# Patient Record
Sex: Female | Born: 2000 | Race: Black or African American | Hispanic: No | Marital: Single
Health system: Southern US, Community
[De-identification: ages and names within clinical notes are randomized; demographics above are authoritative.]

## PROBLEM LIST (undated history)

## (undated) DIAGNOSIS — D649 Anemia, unspecified: Secondary | ICD-10-CM

---

## 2021-07-12 ENCOUNTER — Other Ambulatory Visit: Payer: Self-pay

## 2021-07-12 ENCOUNTER — Emergency Department (HOSPITAL_COMMUNITY): Payer: BC Managed Care – PPO

## 2021-07-12 ENCOUNTER — Encounter (HOSPITAL_COMMUNITY): Payer: Self-pay | Admitting: Emergency Medicine

## 2021-07-12 ENCOUNTER — Emergency Department (HOSPITAL_COMMUNITY)
Admission: EM | Admit: 2021-07-12 | Discharge: 2021-07-13 | Disposition: A | Discharge status: Home / Self Care | Payer: BC Managed Care – PPO | Attending: Emergency Medicine | Admitting: Emergency Medicine

## 2021-07-12 DIAGNOSIS — R110 Nausea: Secondary | ICD-10-CM

## 2021-07-12 DIAGNOSIS — R1032 Left lower quadrant pain: Secondary | ICD-10-CM | POA: Insufficient documentation

## 2021-07-12 DIAGNOSIS — R112 Nausea with vomiting, unspecified: Secondary | ICD-10-CM | POA: Insufficient documentation

## 2021-07-12 HISTORY — DX: Anemia, unspecified: D64.9

## 2021-07-12 LAB — URINALYSIS, ROUTINE W REFLEX MICROSCOPIC
Bilirubin Urine: NEGATIVE
Glucose, UA: NEGATIVE mg/dL
Hgb urine dipstick: NEGATIVE
Ketones, ur: NEGATIVE mg/dL
Leukocytes,Ua: NEGATIVE
Nitrite: NEGATIVE
Protein, ur: NEGATIVE mg/dL
Specific Gravity, Urine: 1.02 (ref 1.005–1.030)
pH: 7 (ref 5.0–8.0)

## 2021-07-12 LAB — I-STAT BETA HCG BLOOD, ED (MC, WL, AP ONLY): I-stat hCG, quantitative: <5 m[IU]/mL (ref <5)

## 2021-07-12 LAB — COMPREHENSIVE METABOLIC PANEL
ALT: 13 U/L (ref 0–44)
AST: 16 U/L (ref 15–41)
Albumin: 3.2 g/dL — ABNORMAL LOW (ref 3.5–5.0)
Alkaline Phosphatase: 57 U/L (ref 38–126)
Anion gap: 9 (ref 5–15)
BUN: 12 mg/dL (ref 6–20)
CO2: 22 mmol/L (ref 22–32)
Calcium: 8.7 mg/dL — ABNORMAL LOW (ref 8.9–10.3)
Chloride: 107 mmol/L (ref 98–111)
Creatinine, Ser: 0.7 mg/dL (ref 0.44–1.00)
GFR, Estimated: >60 mL/min (ref >60)
Glucose, Bld: 104 mg/dL — ABNORMAL HIGH (ref 70–99)
Potassium: 3.9 mmol/L (ref 3.5–5.1)
Sodium: 138 mmol/L (ref 135–145)
Total Bilirubin: 0.2 mg/dL — ABNORMAL LOW (ref 0.3–1.2)
Total Protein: 6.6 g/dL (ref 6.5–8.1)

## 2021-07-12 LAB — CBC
HCT: 34.6 % — ABNORMAL LOW (ref 36.0–46.0)
Hemoglobin: 10.2 g/dL — ABNORMAL LOW (ref 12.0–15.0)
MCH: 20.6 pg — ABNORMAL LOW (ref 26.0–34.0)
MCHC: 29.5 g/dL — ABNORMAL LOW (ref 30.0–36.0)
MCV: 70 fL — ABNORMAL LOW (ref 80.0–100.0)
Platelets: 441 10*3/uL — ABNORMAL HIGH (ref 150–400)
RBC: 4.94 MIL/uL (ref 3.87–5.11)
RDW: 17.2 % — ABNORMAL HIGH (ref 11.5–15.5)
WBC: 10.3 10*3/uL (ref 4.0–10.5)
nRBC: 0 % (ref 0.0–0.2)

## 2021-07-12 LAB — LIPASE, BLOOD: Lipase: 31 U/L (ref 11–51)

## 2021-07-12 NOTE — ED Provider Notes (Signed)
Emergency Medicine Provider Triage Evaluation Note  Alyssa Ruiz , a 20 y.o. female  was evaluated in triage.  Pt complains of gradual onset, constant, sharp, left lower quadrant abdominal pain that began earlier this morning with associated nausea and nonbloody nonbilious emesis.  She last had a normal bowel movement this morning.  Denies any vaginal discharge.  Last normal menstrual cycle was 11/19.  She reports history of ovarian cyst rupture in April of last year and states this feels very similar.  She states she did have vomiting with same.  Believes the cyst was on her left side as well.  Review of Systems  Positive: + LLQ pain, nausea, vomiting Negative: - diarrhea, fevers, chills  Physical Exam  BP 126/79 (BP Location: Left Arm)   Pulse (!) 114   Temp 99.1 F (37.3 C) (Oral)   Resp 15   LMP 06/24/2021 (Exact Date)   SpO2 100%  Gen:   Awake, no distress   Resp:  Normal effort  MSK:   Moves extremities without difficulty  Other:  Tachycardic. + LLQ abd pain.   Medical Decision Making  Medically screening exam initiated at 4:57 PM.  Appropriate orders placed.  Alyssa Ruiz was informed that the remainder of the evaluation will be completed by another provider, this initial triage assessment does not replace that evaluation, and the importance of remaining in the ED until their evaluation is complete.     Tanda Rockers, PA-C 07/12/21 1658    Gerhard Munch, MD 07/12/21 1800

## 2021-07-12 NOTE — ED Triage Notes (Signed)
Patient here for evaluation of emesis and lower left abdominal pain that started this morning, patient states she felt similar symptoms last year when she had an ovarian cyst rupture. Patient alert, oriented, and in no apparent distress at this time.

## 2021-07-13 ENCOUNTER — Encounter (HOSPITAL_COMMUNITY): Payer: Self-pay | Admitting: Emergency Medicine

## 2021-07-13 MED ORDER — SODIUM CHLORIDE 0.9 % IV BOLUS
1000.0000 mL | Freq: Once | INTRAVENOUS | Status: AC
  Administered 2021-07-13: 1000 mL via INTRAVENOUS

## 2021-07-13 MED ORDER — MECLIZINE HCL 25 MG PO TABS: 12.5000 mg | ORAL_TABLET | Freq: Once | ORAL | Status: DC

## 2021-07-13 MED ORDER — ONDANSETRON HCL 4 MG PO TABS
8.0000 mg | ORAL_TABLET | Freq: Once | ORAL | Status: AC
  Administered 2021-07-13: 8 mg via ORAL
  Filled 2021-07-13: qty 2

## 2021-07-13 MED ORDER — KETOROLAC TROMETHAMINE 30 MG/ML IJ SOLN
30.0000 mg | Freq: Once | INTRAMUSCULAR | Status: AC
  Administered 2021-07-13: 30 mg via INTRAMUSCULAR
  Filled 2021-07-13: qty 1

## 2021-07-13 MED ORDER — ALUM & MAG HYDROXIDE-SIMETH 200-200-20 MG/5ML PO SUSP
30.0000 mL | Freq: Once | ORAL | Status: AC
  Administered 2021-07-13: 30 mL via ORAL
  Filled 2021-07-13: qty 30

## 2021-07-13 MED ORDER — DICYCLOMINE HCL 10 MG/5ML PO SOLN
10.0000 mg | Freq: Once | ORAL | Status: AC
  Administered 2021-07-13: 10 mg via ORAL
  Filled 2021-07-13: qty 5

## 2021-07-13 MED ORDER — PROCHLORPERAZINE EDISYLATE 10 MG/2ML IJ SOLN
10.0000 mg | Freq: Once | INTRAMUSCULAR | Status: AC
  Administered 2021-07-13: 10 mg via INTRAVENOUS
  Filled 2021-07-13: qty 2

## 2021-07-13 MED ORDER — LIDOCAINE VISCOUS HCL 2 % MT SOLN
15.0000 mL | Freq: Once | OROMUCOSAL | Status: AC
  Administered 2021-07-13: 15 mL via ORAL
  Filled 2021-07-13: qty 15

## 2021-07-13 MED ORDER — ONDANSETRON HCL 4 MG PO TABS: 4.0000 mg | ORAL_TABLET | Freq: Four times a day (QID) | ORAL | 0 refills | Status: AC

## 2021-07-13 NOTE — ED Notes (Signed)
Pt verbalized understanding of d/c instructions, meds and followup care. Denies questions. VSS, no distress noted. Steady gait to exit with all belongings.  ?

## 2021-07-13 NOTE — ED Provider Notes (Signed)
[facility] EMERGENCY DEPARTMENT Provider Note   CSN: [id number] Arrival date & time: 07/12/21  [id number]     History Chief Complaint  Patient presents with   Emesis    Alyssa Ruiz is a 20 y.o. female. Patient presents to the emergency department with left lower quadrant abdominal pain that started a morning.  She says that his been gradually worsening.  She describes it as being constant and sharp.  She has had associated nausea with some vomiting that has been nonbloody and nonbilious.  She has not vomited since yesterday.  She has had similar symptoms before one other time when she had a ruptured ovarian cyst.  She says that she feels like she is going to have diarrhea, however has had not had any of this and also denies constipation.  Denies melena.  She denies chest pain, shortness of breath, fevers, chills, vaginal discharge, dysuria, or hematuria.    Emesis Associated symptoms: abdominal pain   Associated symptoms: no arthralgias, no chills, no cough, no diarrhea, no fever and no sore throat       Past Medical History:  Diagnosis Date   Anemia     There are no problems to display for this patient.   History reviewed. No pertinent surgical history.   OB History   No obstetric history on file.     History reviewed. No pertinent family history.     Home Medications Prior to Admission medications   Medication Sig Start Date End Date Taking? Authorizing Provider  ondansetron (ZOFRAN) 4 MG tablet Take 1 tablet (4 mg total) by mouth every 6 (six) hours for 5 days. 07/13/21 07/18/21 Yes Shyam Dawson, Adora Fridge, PA-C    Allergies    Patient has no known allergies.  Review of Systems   Review of Systems  Constitutional:  Negative for chills and fever.  HENT:  Negative for ear pain and sore throat.   Eyes:  Negative for pain and visual disturbance.  Respiratory:  Negative for cough and shortness of breath.   Cardiovascular:  Negative for chest pain and  palpitations.  Gastrointestinal:  Positive for abdominal pain, nausea and vomiting. Negative for abdominal distention, blood in stool, constipation and diarrhea.  Genitourinary:  Negative for dysuria, hematuria, vaginal bleeding and vaginal discharge.  Musculoskeletal:  Negative for arthralgias and back pain.  Skin:  Negative for color change and rash.  Neurological:  Negative for seizures and syncope.  Psychiatric/Behavioral:  Negative for confusion.   All other systems reviewed and are negative.  Physical Exam Updated Vital Signs BP (!) 93/57   Pulse 86   Temp 98 F (36.7 C) (Oral)   Resp 17   LMP 06/24/2021 (Exact Date)   SpO2 100%   Physical Exam Vitals and nursing note reviewed.  Constitutional:      General: She is not in acute distress.    Appearance: Normal appearance. She is well-developed. She is not ill-appearing, toxic-appearing or diaphoretic.  HENT:     Head: Normocephalic and atraumatic.     Nose: No nasal deformity.     Mouth/Throat:     Lips: Pink. No lesions.  Eyes:     General: Gaze aligned appropriately. No scleral icterus.       Right eye: No discharge.        Left eye: No discharge.     Conjunctiva/sclera: Conjunctivae normal.     Right eye: Right conjunctiva is not injected. No exudate or hemorrhage.    Left eye: Left  conjunctiva is not injected. No exudate or hemorrhage. Cardiovascular:     Rate and Rhythm: Normal rate and regular rhythm.     Pulses: Normal pulses.          Radial pulses are 2+ on the right side and 2+ on the left side.       Dorsalis pedis pulses are 2+ on the right side and 2+ on the left side.     Heart sounds: Normal heart sounds. No murmur heard.   No friction rub. No gallop. No S3 or S4 sounds.  Pulmonary:     Effort: Pulmonary effort is normal. No respiratory distress.     Breath sounds: Normal breath sounds. No stridor. No wheezing, rhonchi or rales.  Abdominal:     General: Abdomen is flat. There is no distension.      Palpations: Abdomen is soft.     Tenderness: There is abdominal tenderness. There is no right CVA tenderness, left CVA tenderness, guarding or rebound.     Comments: Mild tenderness to left lower quadrant of abdomen.  No peritoneal signs.  Musculoskeletal:     Right lower leg: No edema.     Left lower leg: No edema.  Skin:    General: Skin is warm and dry.     Coloration: Skin is not jaundiced or pale.     Findings: No bruising, erythema, lesion or rash.  Neurological:     Mental Status: She is alert and oriented to person, place, and time.  Psychiatric:        Mood and Affect: Mood normal.        Speech: Speech normal.        Behavior: Behavior normal. Behavior is cooperative.    ED Results / Procedures / Treatments   Labs (all labs ordered are listed, but only abnormal results are displayed) Labs Reviewed  COMPREHENSIVE METABOLIC PANEL - Abnormal; Notable for the following components:      Result Value   Glucose, Bld 104 (*)    Calcium 8.7 (*)    Albumin 3.2 (*)    Total Bilirubin 0.2 (*)    All other components within normal limits  CBC - Abnormal; Notable for the following components:   Hemoglobin 10.2 (*)    HCT 34.6 (*)    MCV 70.0 (*)    MCH 20.6 (*)    MCHC 29.5 (*)    RDW 17.2 (*)    Platelets 441 (*)    All other components within normal limits  LIPASE, BLOOD  URINALYSIS, ROUTINE W REFLEX MICROSCOPIC  I-STAT BETA HCG BLOOD, ED ([facility], [facility], [facility] ONLY)    EKG None  Radiology US Transvaginal Non-OB  Result Date: 07/12/2021 CLINICAL DATA:  Left lower quadrant pain. EXAM: TRANSABDOMINAL AND TRANSVAGINAL ULTRASOUND OF PELVIS DOPPLER ULTRASOUND OF OVARIES TECHNIQUE: Both transabdominal and transvaginal ultrasound examinations of the pelvis were performed. Transabdominal technique was performed for global imaging of the pelvis including uterus, ovaries, adnexal regions, and pelvic cul-de-sac. It was necessary to proceed with endovaginal exam following the transabdominal  exam to visualize the left ovary. Color and duplex Doppler ultrasound was utilized to evaluate blood flow to the ovaries. COMPARISON:  None. FINDINGS: Uterus Measurements: 6.8 x 4.4 x 3.8 cm = volume: 60 mL. No fibroids or other mass visualized. Endometrium Thickness: 10.3.  No focal abnormality visualized. Right ovary Measurements: 3.3 x 3.4 x 2.6 cm = volume: 15 mL. 2.0 x 1.6 x 1.5 cm hemorrhagic/corpus luteal cyst in the right ovary,  which is physiologic and requires no follow-up. Left ovary Measurements: 3.5 x 3.4 x 1.7 cm = volume: 11 mL. Normal appearance/no adnexal mass. Pulsed Doppler evaluation of both ovaries demonstrates normal low-resistance arterial and venous waveforms. Other findings Trace pelvic free fluid. IMPRESSION: 2 cm hemorrhagic/corpus luteal cyst in the right ovary which is physiologic and requires no further imaging follow-up. Otherwise unremarkable pelvic ultrasound. Specifically no evidence of ovarian torsion. Electronically Signed   By: Maudry Mayhew M.D.   On: 07/12/2021 17:59   US Pelvis Complete  Result Date: 07/12/2021 CLINICAL DATA:  Left lower quadrant pain. EXAM: TRANSABDOMINAL AND TRANSVAGINAL ULTRASOUND OF PELVIS DOPPLER ULTRASOUND OF OVARIES TECHNIQUE: Both transabdominal and transvaginal ultrasound examinations of the pelvis were performed. Transabdominal technique was performed for global imaging of the pelvis including uterus, ovaries, adnexal regions, and pelvic cul-de-sac. It was necessary to proceed with endovaginal exam following the transabdominal exam to visualize the left ovary. Color and duplex Doppler ultrasound was utilized to evaluate blood flow to the ovaries. COMPARISON:  None. FINDINGS: Uterus Measurements: 6.8 x 4.4 x 3.8 cm = volume: 60 mL. No fibroids or other mass visualized. Endometrium Thickness: 10.3.  No focal abnormality visualized. Right ovary Measurements: 3.3 x 3.4 x 2.6 cm = volume: 15 mL. 2.0 x 1.6 x 1.5 cm hemorrhagic/corpus luteal cyst in  the right ovary, which is physiologic and requires no follow-up. Left ovary Measurements: 3.5 x 3.4 x 1.7 cm = volume: 11 mL. Normal appearance/no adnexal mass. Pulsed Doppler evaluation of both ovaries demonstrates normal low-resistance arterial and venous waveforms. Other findings Trace pelvic free fluid. IMPRESSION: 2 cm hemorrhagic/corpus luteal cyst in the right ovary which is physiologic and requires no further imaging follow-up. Otherwise unremarkable pelvic ultrasound. Specifically no evidence of ovarian torsion. Electronically Signed   By: Maudry Mayhew M.D.   On: 07/12/2021 17:59   [place] Art/Ven Flow Abd Pelv Doppler  Result Date: 07/12/2021 CLINICAL DATA:  Left lower quadrant pain. EXAM: TRANSABDOMINAL AND TRANSVAGINAL ULTRASOUND OF PELVIS DOPPLER ULTRASOUND OF OVARIES TECHNIQUE: Both transabdominal and transvaginal ultrasound examinations of the pelvis were performed. Transabdominal technique was performed for global imaging of the pelvis including uterus, ovaries, adnexal regions, and pelvic cul-de-sac. It was necessary to proceed with endovaginal exam following the transabdominal exam to visualize the left ovary. Color and duplex Doppler ultrasound was utilized to evaluate blood flow to the ovaries. COMPARISON:  None. FINDINGS: Uterus Measurements: 6.8 x 4.4 x 3.8 cm = volume: 60 mL. No fibroids or other mass visualized. Endometrium Thickness: 10.3.  No focal abnormality visualized. Right ovary Measurements: 3.3 x 3.4 x 2.6 cm = volume: 15 mL. 2.0 x 1.6 x 1.5 cm hemorrhagic/corpus luteal cyst in the right ovary, which is physiologic and requires no follow-up. Left ovary Measurements: 3.5 x 3.4 x 1.7 cm = volume: 11 mL. Normal appearance/no adnexal mass. Pulsed Doppler evaluation of both ovaries demonstrates normal low-resistance arterial and venous waveforms. Other findings Trace pelvic free fluid. IMPRESSION: 2 cm hemorrhagic/corpus luteal cyst in the right ovary which is physiologic and requires no  further imaging follow-up. Otherwise unremarkable pelvic ultrasound. Specifically no evidence of ovarian torsion. Electronically Signed   By: Maudry Mayhew M.D.   On: 07/12/2021 17:59    Procedures Procedures   Medications Ordered in ED Medications  ketorolac (TORADOL) 30 MG/ML injection 30 mg (30 mg Intramuscular Given 07/13/21 0922)  ondansetron (ZOFRAN) tablet 8 mg (8 mg Oral Given 07/13/21 0918)  alum & mag hydroxide-simeth (MAALOX/MYLANTA) 200-200-20 MG/5ML suspension 30 mL (  30 mLs Oral Given 07/13/21 0922)    And  lidocaine (XYLOCAINE) 2 % viscous mouth solution 15 mL (15 mLs Oral Given 07/13/21 0922)  dicyclomine (BENTYL) 10 MG/5ML solution 10 mg (10 mg Oral Given 07/13/21 0922)  sodium chloride 0.9 % bolus 1,000 mL (1,000 mLs Intravenous New Bag/Given 07/13/21 1024)  prochlorperazine (COMPAZINE) injection 10 mg (10 mg Intravenous Given 07/13/21 1024)    ED Course  I have reviewed the triage vital signs and the nursing notes.  Pertinent labs & imaging results that were available during my care of the patient were reviewed by me and considered in my medical decision making (see chart for details).    MDM Rules/Calculators/A&P                         This is a 20 y.o. female with a PMH of anemia and ovarian cysts who presents to the ED with complaints of constant LLQ pain that started two days ago and has gradually been worsening. She has had associated nausea and vomiting with no diarrhea, but does have diarrhea sensations in her stomach.  No other systemic symptoms present   Vitals: afebrile. She was initially tachycardic on arrival, however by the time I examined patient, her vitals were all stable.  Patient is well appearing and in no acute distress. Abdominal exam with very minimal tenderness to the LLQ. Patient has no peritoneal signs or outwards signs of having pain when I press on this area. She does not look overtly dry. Apparently she is tolerating PO intake very well.   I  personally reviewed all laboratory work and imaging.  CBC is significant for microcytic anemia which appears to be baseline.  Likely due to iron deficiency.  CMP with no concerning labs.  UA negative for infection or other process.  Lipase is negative.  Pregnancy negative.  Ultrasound shows a 2 cm hemorrhagic or corpus luteal cyst in the right ovary which is physiologic and requires no further imaging follow-up.  It does not show any findings in the left ovary.  Labs and imaging are overall unrevealing.  Patient could have had a left ovarian cyst that is since ruptured and not shown on the ultrasound.  Also could be Mittelschmerz as she is mid cycle in her menstrual periods. She also could be having gastroenteritis is leading to her having the nausea and vomiting and diarrhea sensations.  Doubt appendicitis, diverticulitis, or PID given that history, exam, and labs are not consistent.  Overall, I am not super impressed with her exam.  We will trial her on a GI cocktail with antiemetics and Toradol for pain.    [id number]: Reassessed patient. She states that her pain has improved, however she is still nauseous. She has not been drinking much water since she arrived in the ED about 12 hours ago, so will give a liter of fluids and try Compazine.   1045: Reassessed patient. She is now feeling better and is ready to go home.  I discussed with patient the need to follow up with her PCP and gave return precautions for symptom recurrence or worsening. Patient will finish fluids and be discharged home.    Dispo Plan: Likely discharge home with antiemetics.    Portions of this note were generated with [employer]. Dictation errors may occur despite best attempts at proofreading.  Final Clinical Impression(s) / ED Diagnoses Final diagnoses:  Left lower quadrant abdominal pain  Nausea  Rx / DC Orders ED Discharge Orders          Ordered    ondansetron (ZOFRAN) 4 MG tablet  Every 6 hours         07/13/21 1014             Mallie Giambra, Adora Fridge, PA-C 07/13/21 1101    Sherwood Gambler, MD 07/13/21 1305

## 2021-07-13 NOTE — ED Notes (Signed)
Pt called staff to say she had wet herself, had accident. Pt unable to answer why she did not call out or try to get up to use the bathroom, but urinated throughout the stretcher and onto the floor. Pt states symptoms are improved. Pt given blue paper scrubs and shown bathroom to change. EDP updated on pt condition and improvement of GI symptoms, so pt will be d/cd.

## 2021-07-13 NOTE — Discharge Instructions (Signed)

## 2022-05-15 ENCOUNTER — Emergency Department (HOSPITAL_COMMUNITY)
Admission: EM | Admit: 2022-05-15 | Discharge: 2022-05-15 | Disposition: A | Discharge status: Home / Self Care | Payer: BC Managed Care – PPO | Attending: Emergency Medicine | Admitting: Emergency Medicine

## 2022-05-15 ENCOUNTER — Encounter (HOSPITAL_COMMUNITY): Payer: Self-pay

## 2022-05-15 ENCOUNTER — Other Ambulatory Visit: Payer: Self-pay

## 2022-05-15 DIAGNOSIS — R002 Palpitations: Secondary | ICD-10-CM

## 2022-05-15 NOTE — ED Triage Notes (Signed)
Pt said she took a extra 1/2 tab of her ritalin which was 5 mg more than normal and started having a sensation that her heart was racing.

## 2022-05-15 NOTE — ED Provider Notes (Signed)
[facility]-EMERGENCY DEPT Provider Note  CSN: [id number] Arrival date & time: 05/15/22 0117  Chief Complaint(s) Anxiety  HPI Alyssa Ruiz is a 21 y.o. female    The history is provided by the patient.  Palpitations Palpitations quality:  Fast Onset quality:  Gradual Duration:  1 hour Timing:  Constant Progression:  Resolved Chronicity:  New Context comment:  Took extra dose of Ritalin this evening Relieved by: self resolved. Worsened by:  Nothing Associated symptoms: no chest pain, no cough, no diaphoresis, no leg pain, no lower extremity edema, no malaise/fatigue, no nausea, no numbness, no shortness of breath and no vomiting     Past Medical History Past Medical History:  Diagnosis Date   Anemia    There are no problems to display for this patient.  Home Medication(s) Prior to Admission medications   Not on File                                                                                                                                    Allergies Patient has no known allergies.  Review of Systems Review of Systems  Constitutional:  Negative for diaphoresis and malaise/fatigue.  Respiratory:  Negative for cough and shortness of breath.   Cardiovascular:  Positive for palpitations. Negative for chest pain.  Gastrointestinal:  Negative for nausea and vomiting.  Neurological:  Negative for numbness.   As noted in HPI  Physical Exam Vital Signs  I have reviewed the triage vital signs BP 119/86   Pulse 65   Temp 98.6 F (37 C)   Resp 18   Ht 5\' 5"  (1.651 m)   Wt 99.8 kg   LMP 05/07/2022   SpO2 100%   BMI 36.61 kg/m    Physical Exam Vitals reviewed.  Constitutional:      General: She is not in acute distress.    Appearance: She is well-developed. She is not diaphoretic.  HENT:     Head: Normocephalic and atraumatic.     Nose: Nose normal.  Eyes:     General: No scleral icterus.       Right eye: No discharge.         Left eye: No discharge.     Conjunctiva/sclera: Conjunctivae normal.     Pupils: Pupils are equal, round, and reactive to light.  Cardiovascular:     Rate and Rhythm: Normal rate and regular rhythm.     Heart sounds: No murmur heard.    No friction rub. No gallop.  Pulmonary:     Effort: Pulmonary effort is normal. No respiratory distress.     Breath sounds: Normal breath sounds. No stridor. No rales.  Abdominal:     General: There is no distension.     Palpations: Abdomen is soft.     Tenderness: There is no abdominal tenderness.  Musculoskeletal:        General: No tenderness.  Cervical back: Normal range of motion and neck supple.  Skin:    General: Skin is warm and dry.     Findings: No erythema or rash.  Neurological:     Mental Status: She is alert and oriented to person, place, and time.     ED Results and Treatments Labs (all labs ordered are listed, but only abnormal results are displayed) Labs Reviewed - No data to display                                                                                                                       EKG  EKG Interpretation  Date/Time:  Tuesday May 15 2022 03:33:53 EDT Ventricular Rate:  59 PR Interval:  132 QRS Duration: 89 QT Interval:  412 QTC Calculation: 409 R Axis:   55 Text Interpretation: Sinus rhythm Confirmed by Addison Lank [phone] on 05/15/2022 3:37:45 AM       Radiology No results found.  Medications Ordered in ED Medications - No data to display                                                                                                                                   Procedures Procedures  (including critical care time)  Medical Decision Making / ED Course   Medical Decision Making Amount and/or Complexity of Data Reviewed ECG/medicine tests: ordered and independent interpretation performed. Decision-making details documented in ED Course.    Side effect from extra dose of  Ritalin. Palpitations resolved. Less concerning for other likely etiologies including electrolyte derangements, severe anemia, PE. EKG without acute dysrhythmias or interval changes. Patient monitored for several hours without recurrence.     Final Clinical Impression(s) / ED Diagnoses Final diagnoses:  Palpitations   The patient appears reasonably screened and/or stabilized for discharge and I doubt any other medical condition or other [facility] requiring further screening, evaluation, or treatment in the ED at this time. I have discussed the findings, Dx and Tx plan with the patient/family who expressed understanding and agree(s) with the plan. Discharge instructions discussed at length. The patient/family was given strict return precautions who verbalized understanding of the instructions. No further questions at time of discharge.  Disposition: Discharge  Condition: Good  ED Discharge Orders     None        Follow Up: Primary care provider  Call  as needed  This chart was dictated using voice recognition software.  Despite best efforts to proofread,  errors can occur which can change the documentation meaning.    Nira Conn, MD 05/15/22 [phone]

## 2022-07-22 ENCOUNTER — Ambulatory Visit (HOSPITAL_COMMUNITY)
Admission: EM | Admit: 2022-07-22 | Discharge: 2022-07-22 | Disposition: A | Discharge status: Home / Self Care | Payer: No Payment, Other | Attending: Urology | Admitting: Urology

## 2022-07-22 DIAGNOSIS — F319 Bipolar disorder, unspecified: Secondary | ICD-10-CM | POA: Insufficient documentation
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
## 2022-07-22 NOTE — Discharge Instructions (Signed)
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
## 2022-07-23 NOTE — ED Provider Notes (Incomplete)
Behavioral Health Urgent Care Medical Screening Exam  Patient Name: Alyssa Ruiz MRN: [id number] Date of Evaluation: 07/23/22 Chief Complaint:   Diagnosis:  Final diagnoses:  Bipolar I disorder (HCC)    History of Present illness: Alyssa Ruiz is a 21 y.o. female with a self-reported psychiatric history of bipolar 1 disorder.  Patient presented voluntarily  Flowsheet Row ED from 07/22/2022 in [facility] ED from 05/15/2022 in [facility] DEPT ED from 07/12/2021 in [facility] EMERGENCY DEPARTMENT  C-SSRS RISK CATEGORY No Risk No Risk No Risk       Psychiatric Specialty Exam  Presentation  General Appearance:Appropriate for Environment  Eye Contact:Fair  Speech:Clear and Coherent  Speech Volume:Normal  Handedness:Right   Mood and Affect  Mood: Euthymic  Affect: Appropriate   Thought Process  Thought Processes: Coherent  Descriptions of Associations:Intact  Orientation:Full (Time, Place and Person)  Thought Content:WDL  Diagnosis of Schizophrenia or Schizoaffective disorder in past: No   Hallucinations:None  Ideas of Reference:None  Suicidal Thoughts:No  Homicidal Thoughts:No   Sensorium  Memory: Immediate Good; Recent Fair; Remote Fair  Judgment: Fair  Insight: Fair   Art therapist  Concentration: Good  Attention Span: Good  Recall: [employer] of Knowledge: Fair  Language: Fair   Psychomotor Activity  Psychomotor Activity: Normal   Assets  Assets: Communication Skills; Desire for Improvement; Housing; Physical Health   Sleep  Sleep: Fair  Number of hours:  6   No data recorded  Physical Exam: Physical Exam ROS Blood pressure 124/87, pulse 98, temperature 99 F (37.2 C), temperature source Oral, resp. rate 20, SpO2 96 %. There is no height or weight on file to calculate BMI.  Musculoskeletal: Strength & Muscle Tone:  {desc; muscle tone:32375} Gait & Station: {PE GAIT ED NATL:22525} Patient leans: {Patient Leans:[id number]}   [facility] MSE Discharge Disposition for Follow up and Recommendations: {[facility] MSE Recommendations:24277}   Lasaundra Riche A Rubby Barbary, NP 07/23/2022, 12:01 AM

## 2022-09-05 ENCOUNTER — Telehealth: Payer: Self-pay

## 2022-09-05 ENCOUNTER — Telehealth: Payer: Medicaid Other | Admitting: Physician Assistant

## 2022-09-05 DIAGNOSIS — R6889 Other general symptoms and signs: Secondary | ICD-10-CM

## 2022-09-05 MED ORDER — BENZONATATE 100 MG PO CAPS: 100.0000 mg | ORAL_CAPSULE | Freq: Three times a day (TID) | ORAL | 0 refills | Status: AC | PRN

## 2022-09-05 MED ORDER — OSELTAMIVIR PHOSPHATE 75 MG PO CAPS: 75.0000 mg | ORAL_CAPSULE | Freq: Two times a day (BID) | ORAL | 0 refills | Status: AC

## 2022-09-05 NOTE — Patient Instructions (Signed)
Mardi Mainland, thank you for joining [name], PA-C for today's virtual visit.  While this provider is not your primary care provider (PCP), if your PCP is located in our provider database this encounter information will be shared with them immediately following your visit.   [facility] account gives you access to today's visit and all your visits, tests, and labs performed at [facility] " click here if you don't have a [facility] account or go to [email or web address]  Consent: (Patient) Alyssa Ruiz provided verbal consent for this virtual visit at the beginning of the encounter.  Current Medications: No current outpatient medications on file.   Medications ordered in this encounter:  No orders of the defined types were placed in this encounter.    *If you need refills on other medications prior to your next appointment, please contact your pharmacy*  Follow-Up: Call back or seek an in-person evaluation if the symptoms worsen or if the condition fails to improve as anticipated.  [place] [phone]  Other Instructions Please keep well-hydrated and try to get plenty of rest. If you have a humidifier, place it in the bedroom and run it at night. Start a saline nasal rinse for nasal congestion. You can consider use of a nasal steroid spray like Flonase or Nasacort OTC. You can alternate between Tylenol and Ibuprofen if needed for fever, body aches, headache and/or throat pain. Salt water-gargles and chloraseptic spray can be very beneficial for sore throat. Mucinex-DM for congestion or cough. Please take all prescribed medications as directed.  Remain out of work until [employer] for 24 hours without a fever-reducing medication, and you are feeling better.  You should mask until symptoms are resolved.  If anything worsens despite treatment, you need to be evaluated in-person. Please do not delay  care.  Influenza, Adult Influenza is also called "the flu." It is an infection in the lungs, nose, and throat (respiratory tract). It spreads easily from person to person (is contagious). The flu causes symptoms that are like a cold, along with high fever and body aches. What are the causes? This condition is caused by the influenza virus. You can get the virus by: Breathing in droplets that are in the air after a person infected with the flu coughed or sneezed. Touching something that has the virus on it and then touching your mouth, nose, or eyes. What increases the risk? Certain things may make you more likely to get the flu. These include: Not washing your hands often. Having close contact with many people during cold and flu season. Touching your mouth, eyes, or nose without first washing your hands. Not getting a flu shot every year. You may have a higher risk for the flu, and serious problems, such as a lung infection (pneumonia), if you: Are older than 65. Are pregnant. Have a weakened disease-fighting system (immune system) because of a disease or because you are taking certain medicines. Have a long-term (chronic) condition, such as: Heart, kidney, or lung disease. Diabetes. Asthma. Have a liver disorder. Are very overweight (morbidly obese). Have anemia. What are the signs or symptoms? Symptoms usually begin suddenly and last 4-14 days. They may include: Fever and chills. Headaches, body aches, or muscle aches. Sore throat. Cough. Runny or stuffy (congested) nose. Feeling discomfort in your chest. Not wanting to eat as much as normal. Feeling weak or tired. Feeling dizzy. Feeling sick to your stomach or throwing up. How is this treated? If  the flu is found early, you can be treated with antiviral medicine. This can help to reduce how bad the illness is and how long it lasts. This may be given by mouth or through an IV tube. Taking care of yourself at home can help  your symptoms get better. Your doctor may want you to: Take over-the-counter medicines. Drink plenty of fluids. The flu often goes away on its own. If you have very bad symptoms or other problems, you may be treated in a hospital. Follow these instructions at home:     Activity Rest as needed. Get plenty of sleep. Stay home from work or school as told by your doctor. Do not leave home until you do not have a fever for 24 hours without taking medicine. Leave home only to go to your doctor. Eating and drinking Take an ORS (oral rehydration solution). This is a drink that is sold at pharmacies and stores. Drink enough fluid to keep your pee pale yellow. Drink clear fluids in small amounts as you are able. Clear fluids include: Water. Ice chips. Fruit juice mixed with water. Low-calorie sports drinks. Eat bland foods that are easy to digest. Eat small amounts as you are able. These foods include: Bananas. Applesauce. Rice. Lean meats. Toast. Crackers. Do not eat or drink: Fluids that have a lot of sugar or caffeine. Alcohol. Spicy or fatty foods. General instructions Take over-the-counter and prescription medicines only as told by your doctor. Use a cool mist humidifier to add moisture to the air in your home. This can make it easier for you to breathe. When using a cool mist humidifier, clean it daily. Empty water and replace with clean water. Cover your mouth and nose when you cough or sneeze. Wash your hands with soap and water often and for at least 20 seconds. This is also important after you cough or sneeze. If you cannot use soap and water, use alcohol-based hand sanitizer. Keep all follow-up visits. How is this prevented?  Get a flu shot every year. You may get the flu shot in late summer, fall, or winter. Ask your doctor when you should get your flu shot. Avoid contact with people who are sick during fall and winter. This is cold and flu season. Contact a doctor  if: You get new symptoms. You have: Chest pain. Watery poop (diarrhea). A fever. Your cough gets worse. You start to have more mucus. You feel sick to your stomach. You throw up. Get help right away if you: Have shortness of breath. Have trouble breathing. Have skin or nails that turn a bluish color. Have very bad pain or stiffness in your neck. Get a sudden headache. Get sudden pain in your face or ear. Cannot eat or drink without throwing up. These symptoms may represent a serious problem that is an emergency. Get medical help right away. Call your local emergency services (911 in the [place] Do not wait to see if the symptoms will go away. Do not drive yourself to the hospital. Summary Influenza is also called "the flu." It is an infection in the lungs, nose, and throat. It spreads easily from person to person. Take over-the-counter and prescription medicines only as told by your doctor. Getting a flu shot every year is the best way to not get the flu. This information is not intended to replace advice given to you by your health care provider. Make sure you discuss any questions you have with your health care provider. Document Revised: 03/11/2020  Document Reviewed: 03/11/2020 Elsevier Patient Education  [place].      If you have been instructed to have an in-person evaluation today at a local Urgent Care facility, please use the link below. It will take you to a list of all of our available [facility], including address, phone number and hours of operation. Please do not delay care.  [facility]  If you or a family member do not have a primary care provider, use the link below to schedule a visit and establish care. When you choose a [facility] primary care physician or advanced practice provider, you gain a long-term partner in health. Find a Primary Care Provider  Learn more about 's in-office and virtual care  options: [facility]

## 2022-09-05 NOTE — Progress Notes (Signed)
Virtual Visit Consent   Alyssa Ruiz, you are scheduled for a virtual visit with a [facility] provider today. Just as with appointments in the office, your consent must be obtained to participate. Your consent will be active for this visit and any virtual visit you may have with one of our providers in the next 365 days. If you have a MyChart account, a copy of this consent can be sent to you electronically.  As this is a virtual visit, video technology does not allow for your provider to perform a traditional examination. This may limit your provider's ability to fully assess your condition. If your provider identifies any concerns that need to be evaluated in person or the need to arrange testing (such as labs, EKG, etc.), we will make arrangements to do so. Although advances in technology are sophisticated, we cannot ensure that it will always work on either your end or our end. If the connection with a video visit is poor, the visit may have to be switched to a telephone visit. With either a video or telephone visit, we are not always able to ensure that we have a secure connection.  By engaging in this virtual visit, you consent to the provision of healthcare and authorize for your insurance to be billed (if applicable) for the services provided during this visit. Depending on your insurance coverage, you may receive a charge related to this service.  I need to obtain your verbal consent now. Are you willing to proceed with your visit today? Alyssa Ruiz has provided verbal consent on 09/05/2022 for a virtual visit (video or telephone). Alyssa Ruiz, [place]  Date: 09/05/2022 4:03 PM  Virtual Visit via Video Note   I, Alyssa Ruiz, connected with  [name]  ([id number], Oct 17, 2000) on 09/05/22 at  4:00 PM EST by a video-enabled telemedicine application and verified that I am speaking with the correct person using two identifiers.  Location: Patient: Virtual Visit  Location Patient: Home Provider: Virtual Visit Location Provider: Home Office   I discussed the limitations of evaluation and management by telemedicine and the availability of in person appointments. The patient expressed understanding and agreed to proceed.    History of Present Illness: Alyssa Ruiz is a 22 y.o. who identifies as a female who was assigned female at birth, and is being seen today for concern of flu like symptoms starting Monday night into Tuesday. Initially with sore throat but by Tuesday morning having nasal congestion, cough, fever (initially > 101), body aches and chills. Cough is sometimes dry but other times productive. Denies chest pain or SOB. Has classmates who have been sick. Took home COVID test which was negative. Has been taking OTC Ibuprofen.   HPI: HPI  Problems: There are no problems to display for this patient.   Allergies: No Known Allergies Medications:  Current Outpatient Medications:    benzonatate (TESSALON) 100 MG capsule, Take 1 capsule (100 mg total) by mouth 3 (three) times daily as needed for cough., Disp: 30 capsule, Rfl: 0   oseltamivir (TAMIFLU) 75 MG capsule, Take 1 capsule (75 mg total) by mouth 2 (two) times daily., Disp: 10 capsule, Rfl: 0  Observations/Objective: Patient is well-developed, well-nourished in no acute distress.  Resting comfortably at home.  Head is normocephalic, atraumatic.  No labored breathing. Speech is clear and coherent with logical content.  Patient is alert and oriented at baseline.   Assessment and Plan: 1. Flu-like symptoms - oseltamivir (TAMIFLU) 75 MG capsule; Take 1  capsule (75 mg total) by mouth 2 (two) times daily.  Dispense: 10 capsule; Refill: 0 - benzonatate (TESSALON) 100 MG capsule; Take 1 capsule (100 mg total) by mouth 3 (three) times daily as needed for cough.  Dispense: 30 capsule; Refill: 0  Negative COVID. Classic influenza symptoms. Likely exposure. Supportive measures, OTC medications and  Vitamin recommendations reviewed. Will start Tamiflu per orders. Tessalon per orders. Quarantine reviewed with patient.    Follow Up Instructions: I discussed the assessment and treatment plan with the patient. The patient was provided an opportunity to ask questions and all were answered. The patient agreed with the plan and demonstrated an understanding of the instructions.  A copy of instructions were sent to the patient via MyChart unless otherwise noted below.   The patient was advised to call back or seek an in-person evaluation if the symptoms worsen or if the condition fails to improve as anticipated.  Time:  I spent 10 minutes with the patient via telehealth technology discussing the above problems/concerns.    Alyssa Rio, PA-C

## 2022-09-07 ENCOUNTER — Telehealth: Payer: BC Managed Care – PPO

## 2022-09-07 NOTE — Progress Notes (Deleted)
No show for Video Visit

## 2023-09-10 IMAGING — US US PELVIS COMPLETE
1 series · 13 of 25 positions shown · non-contrast
Comparison: None.

CLINICAL DATA: Left lower quadrant pain.

EXAM:
TRANSABDOMINAL AND TRANSVAGINAL ULTRASOUND OF PELVIS
DOPPLER ULTRASOUND OF OVARIES
TECHNIQUE: Both transabdominal and transvaginal ultrasound examinations of the
pelvis were performed. Transabdominal technique was performed for
global imaging of the pelvis including uterus, ovaries, adnexal
regions, and pelvic cul-de-sac.
It was necessary to proceed with endovaginal exam following the
transabdominal exam to visualize the left ovary. Color and duplex
Doppler ultrasound was utilized to evaluate blood flow to the
ovaries.

[Series 1: us pelvis (transabdominal only) · 13 of 78 slices shown]
[im 1/78]
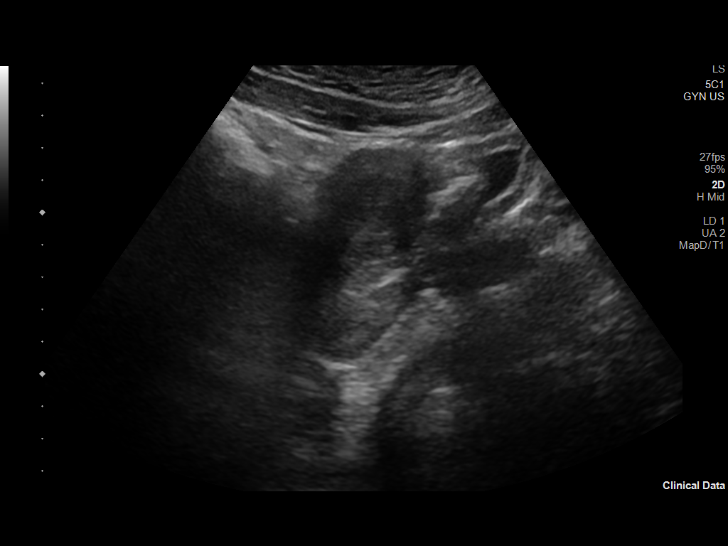
[im 7/78]
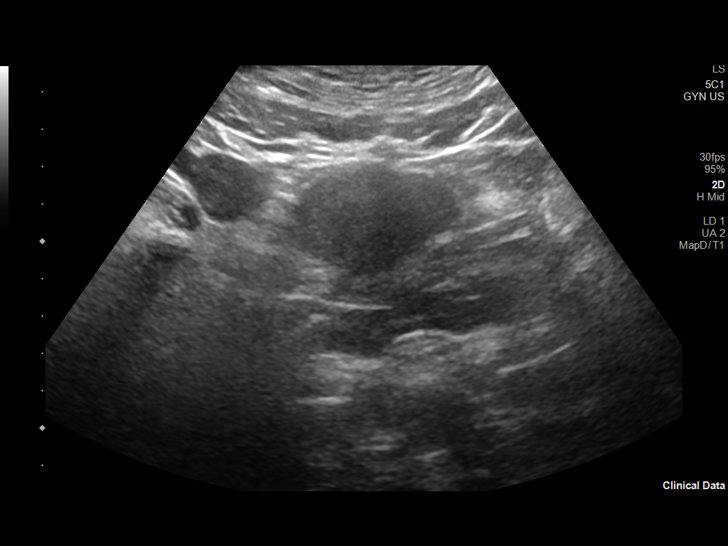
[im 13/78]
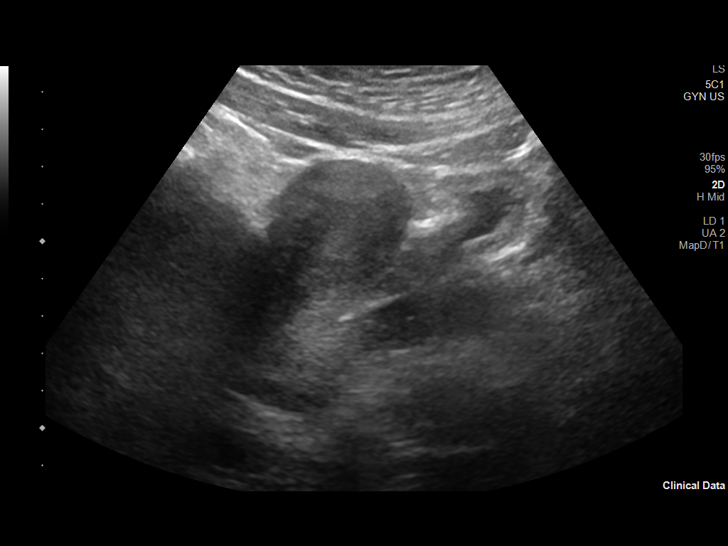
[im 20/78]
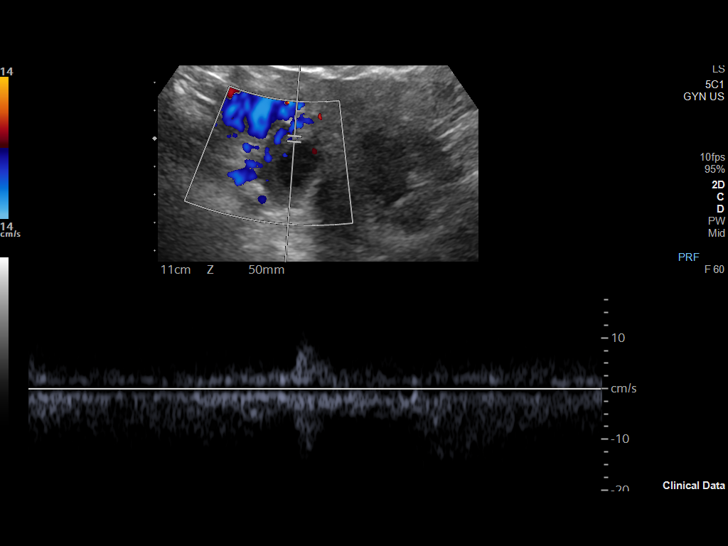
[im 26/78]
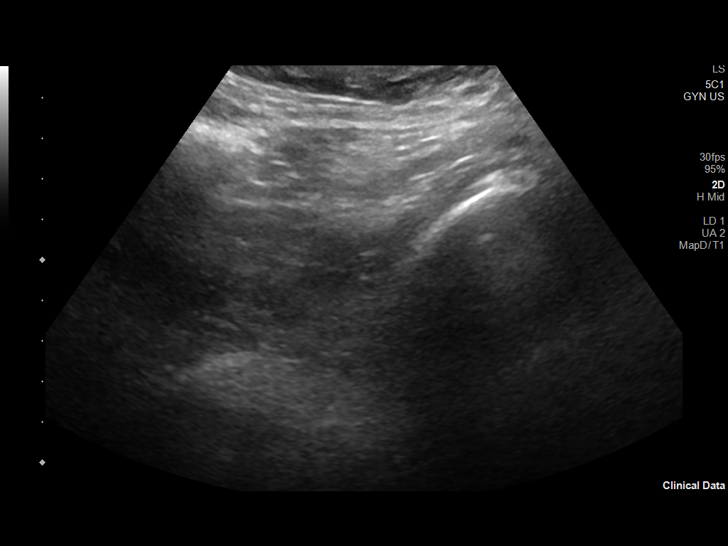
[im 33/78]
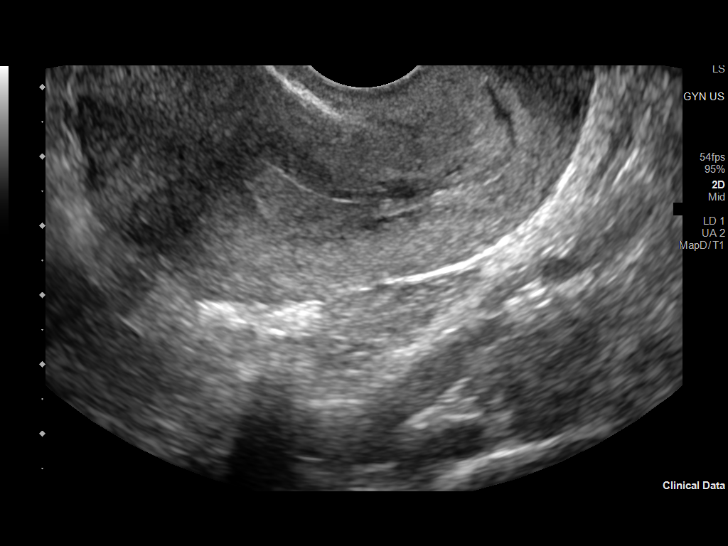
[im 39/78]
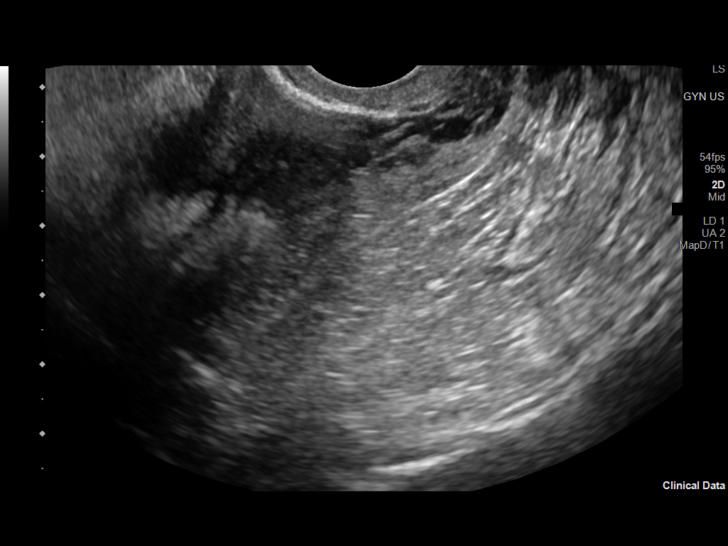
[im 45/78]
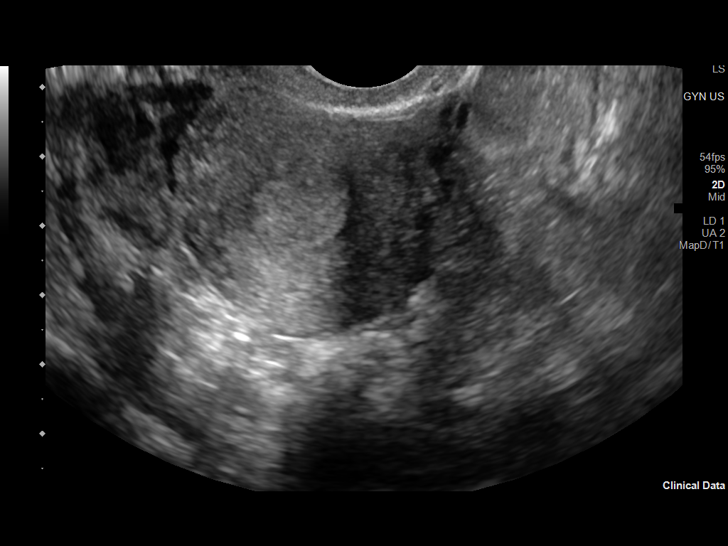
[im 52/78]
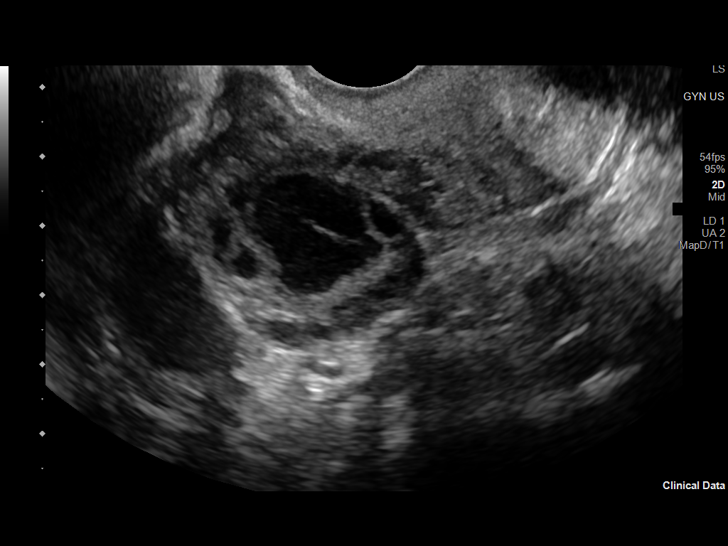
[im 58/78]
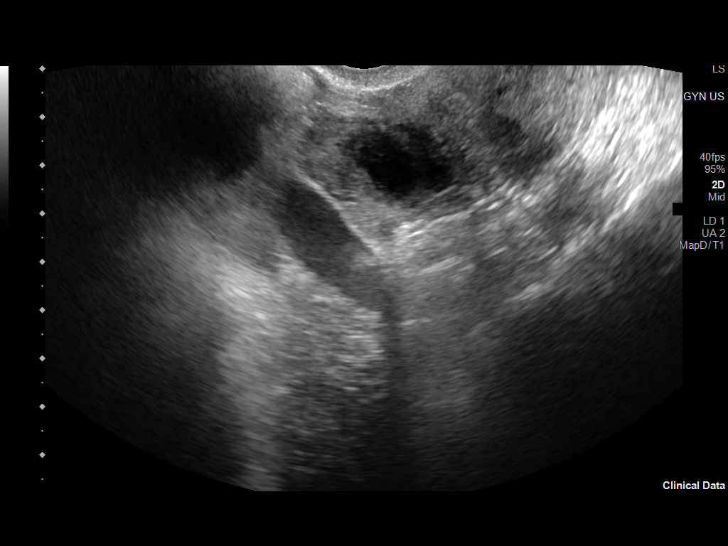
[im 65/78]
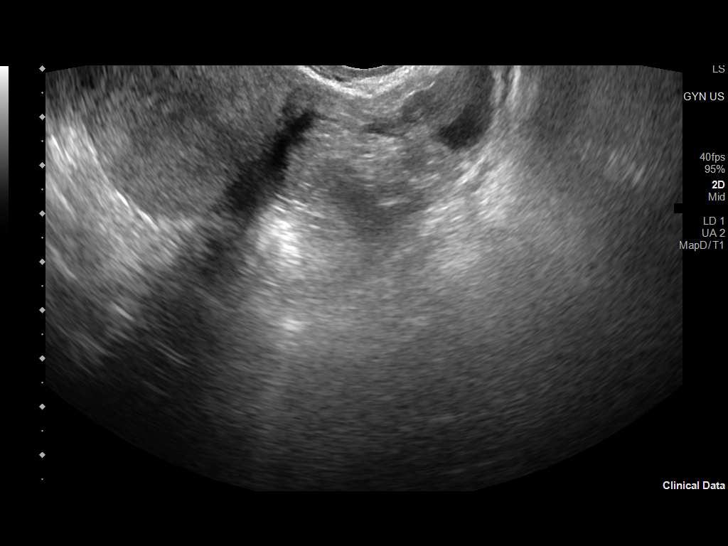
[im 71/78]
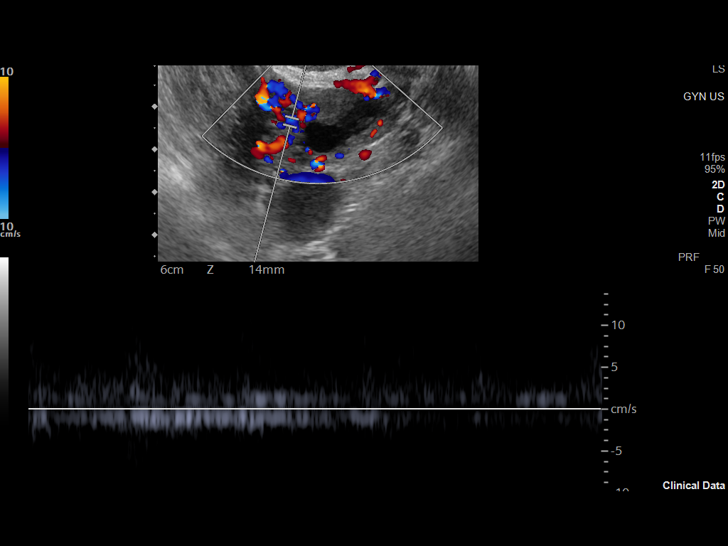
[im 78/78]
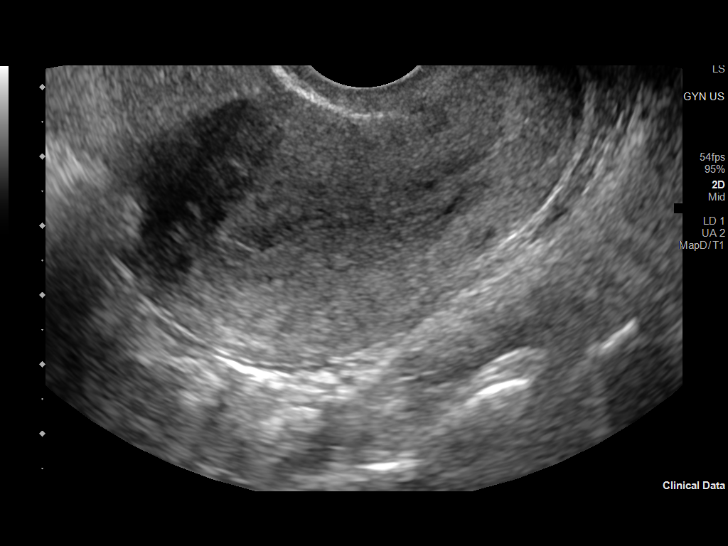

[13 of 25 positions shown; findings below may reference images not displayed]

FINDINGS: Uterus

Measurements: 6.8 x 4.4 x 3.8 cm = volume: 60 mL. No fibroids or
other mass visualized.

Endometrium

Thickness: 10.3.  No focal abnormality visualized.

Right ovary

Measurements: 3.3 x 3.4 x 2.6 cm = volume: 15 mL. 2.0 x 1.6 x 1.5 cm
hemorrhagic/corpus luteal cyst in the right ovary, which is
physiologic and requires no follow-up.

Left ovary

Measurements: 3.5 x 3.4 x 1.7 cm = volume: 11 mL. Normal
appearance/no adnexal mass.

Pulsed Doppler evaluation of both ovaries demonstrates normal
low-resistance arterial and venous waveforms.

Other findings

Trace pelvic free fluid.
IMPRESSION: 2 cm hemorrhagic/corpus luteal cyst in the right ovary which is
physiologic and requires no further imaging follow-up. Otherwise
unremarkable pelvic ultrasound. Specifically no evidence of ovarian
torsion.

## 2023-09-10 IMAGING — US US TRANSVAGINAL NON-OB
1 series · 13 of 25 positions shown · non-contrast
Comparison: None.

CLINICAL DATA: Left lower quadrant pain.

EXAM:
TRANSABDOMINAL AND TRANSVAGINAL ULTRASOUND OF PELVIS
DOPPLER ULTRASOUND OF OVARIES
TECHNIQUE: Both transabdominal and transvaginal ultrasound examinations of the
pelvis were performed. Transabdominal technique was performed for
global imaging of the pelvis including uterus, ovaries, adnexal
regions, and pelvic cul-de-sac.
It was necessary to proceed with endovaginal exam following the
transabdominal exam to visualize the left ovary. Color and duplex
Doppler ultrasound was utilized to evaluate blood flow to the
ovaries.

[Series 1: us pelvis (transabdominal only) · 13 of 78 slices shown]
[im 1/78]
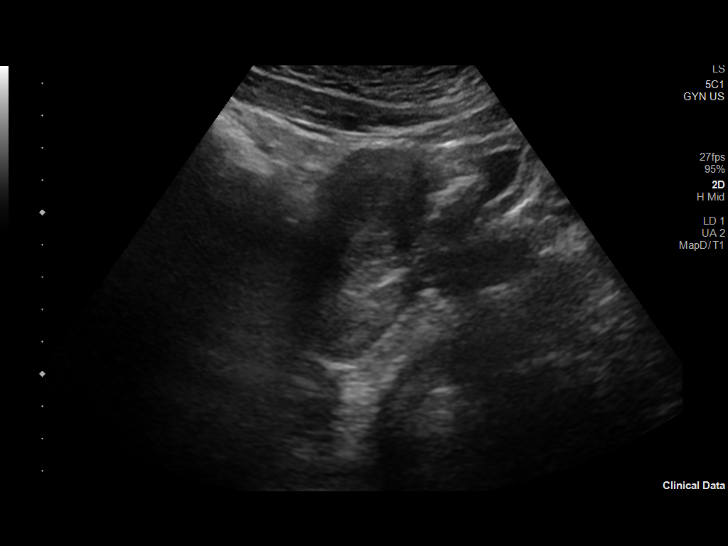
[im 7/78]
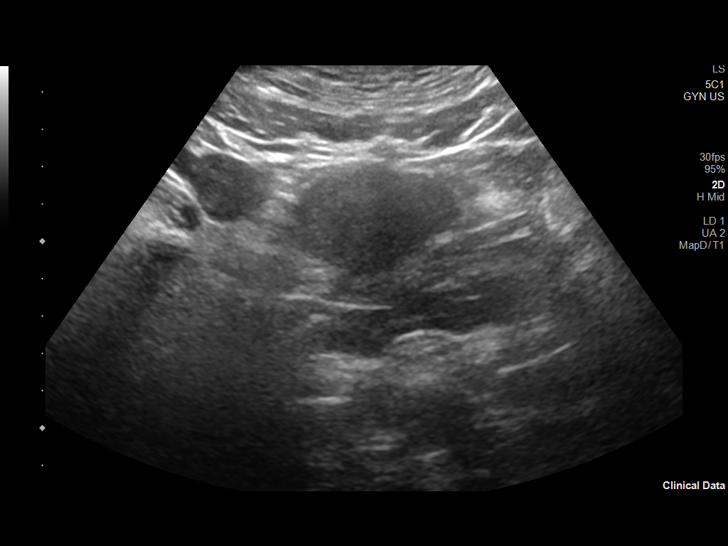
[im 13/78]
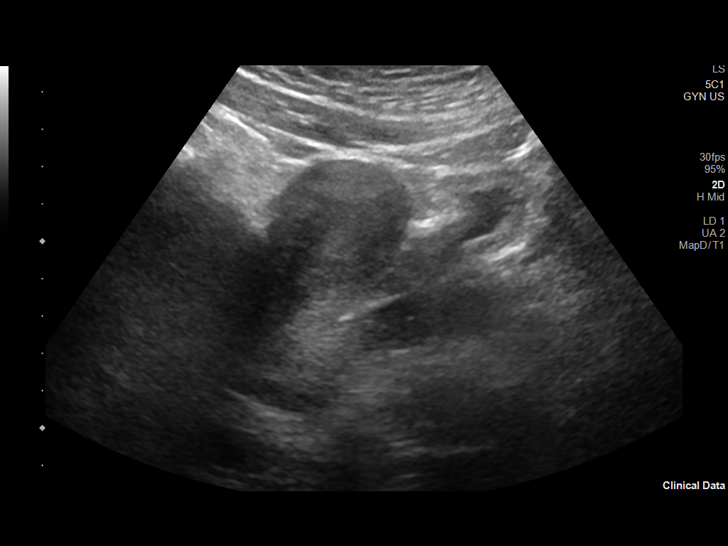
[im 20/78]
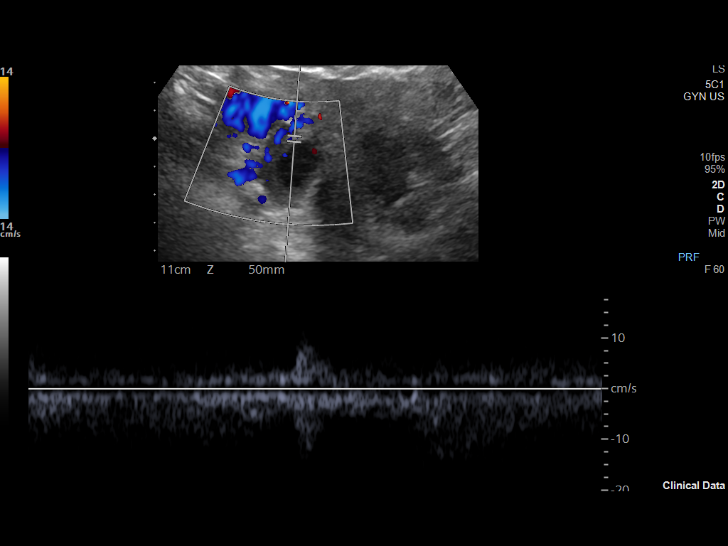
[im 26/78]
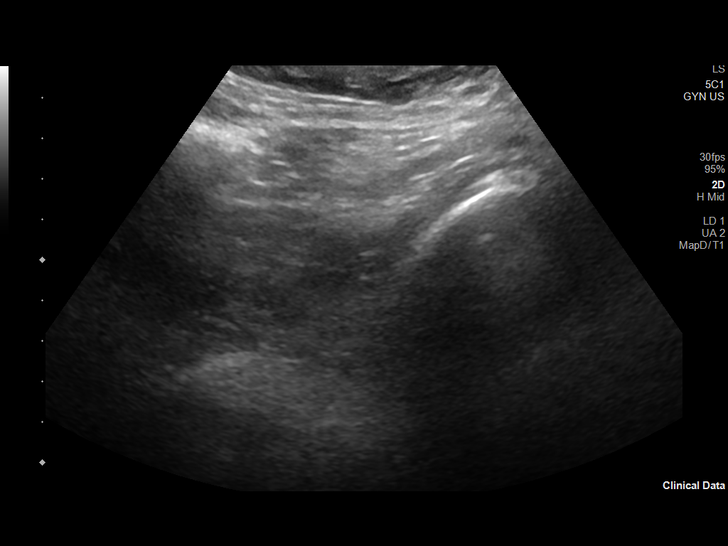
[im 33/78]
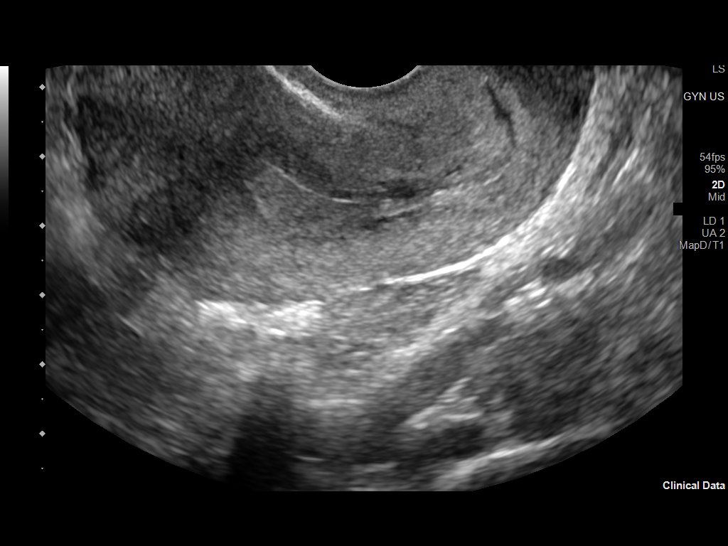
[im 39/78]
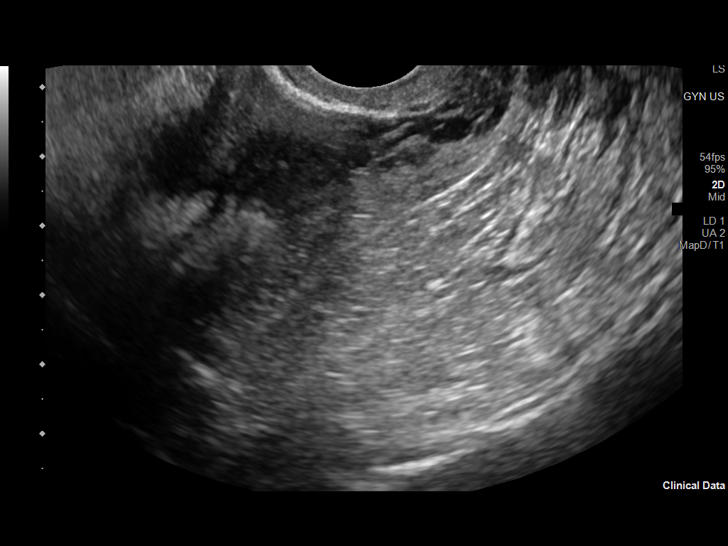
[im 45/78]
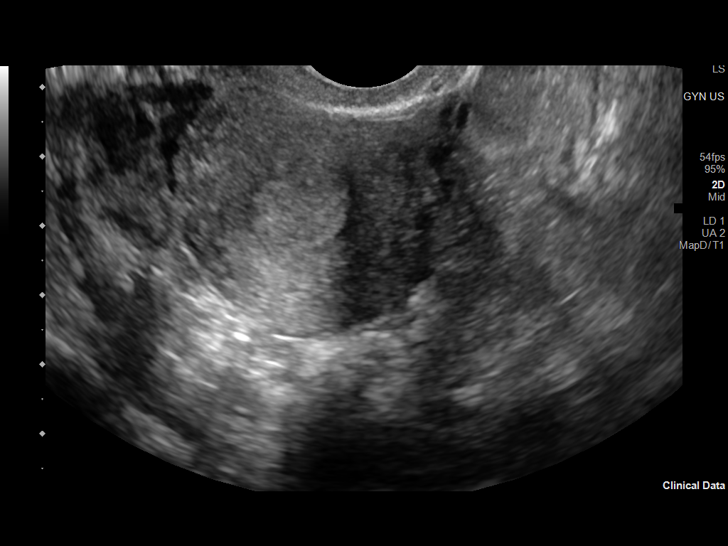
[im 52/78]
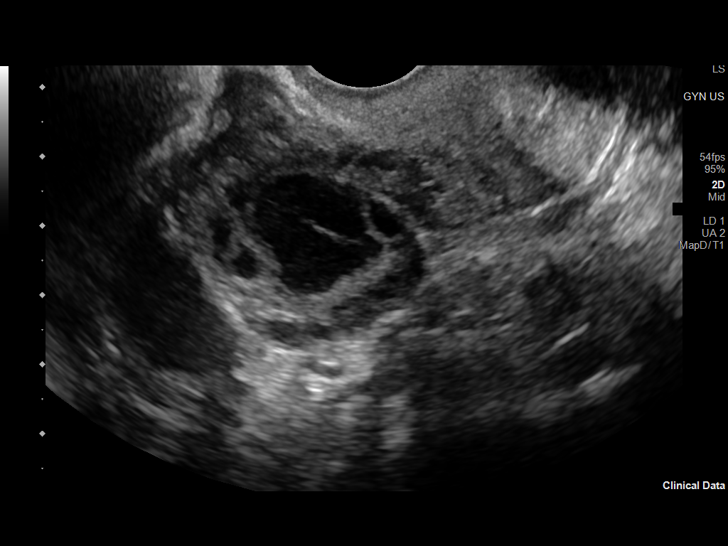
[im 58/78]
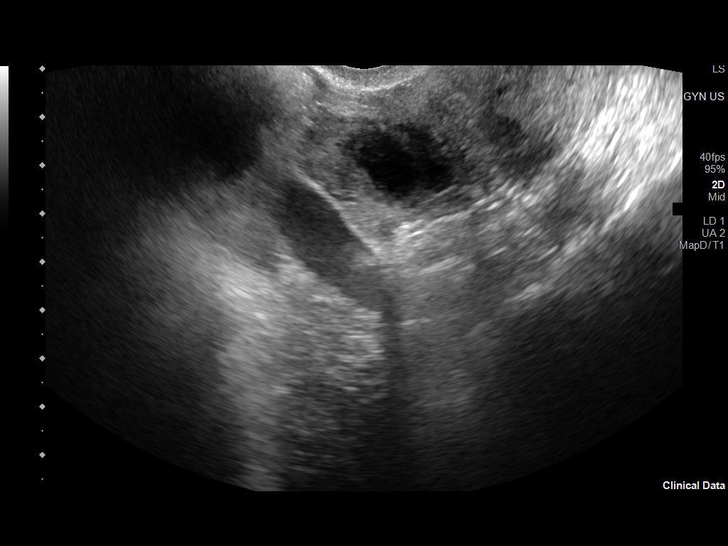
[im 65/78]
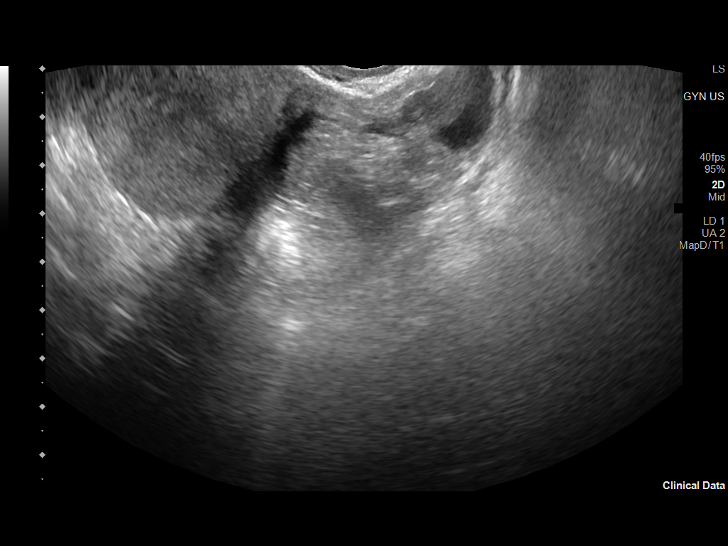
[im 71/78]
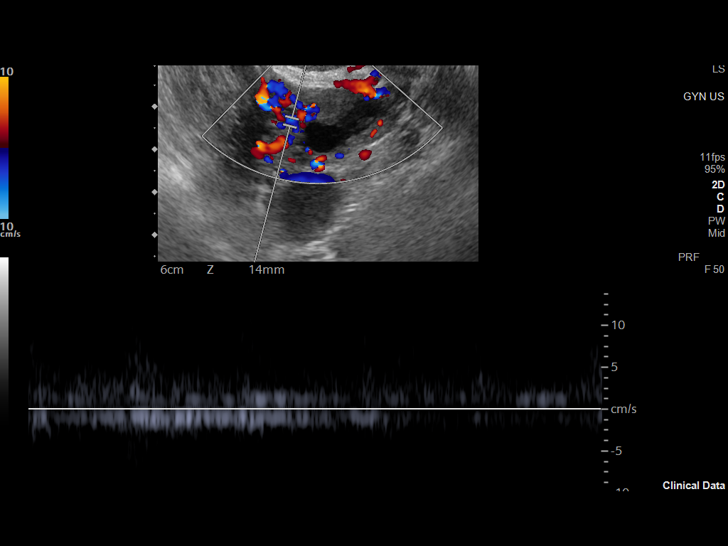
[im 78/78]
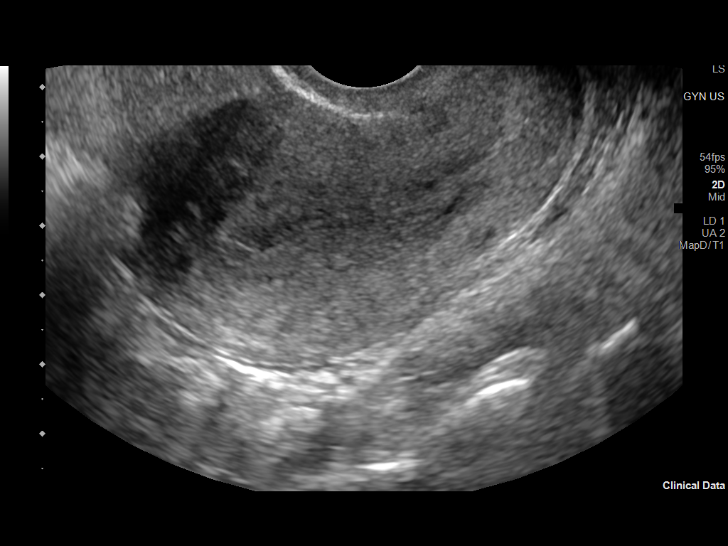

[13 of 25 positions shown; findings below may reference images not displayed]

FINDINGS: Uterus

Measurements: 6.8 x 4.4 x 3.8 cm = volume: 60 mL. No fibroids or
other mass visualized.

Endometrium

Thickness: 10.3.  No focal abnormality visualized.

Right ovary

Measurements: 3.3 x 3.4 x 2.6 cm = volume: 15 mL. 2.0 x 1.6 x 1.5 cm
hemorrhagic/corpus luteal cyst in the right ovary, which is
physiologic and requires no follow-up.

Left ovary

Measurements: 3.5 x 3.4 x 1.7 cm = volume: 11 mL. Normal
appearance/no adnexal mass.

Pulsed Doppler evaluation of both ovaries demonstrates normal
low-resistance arterial and venous waveforms.

Other findings

Trace pelvic free fluid.
IMPRESSION: 2 cm hemorrhagic/corpus luteal cyst in the right ovary which is
physiologic and requires no further imaging follow-up. Otherwise
unremarkable pelvic ultrasound. Specifically no evidence of ovarian
torsion.

## 2023-09-10 IMAGING — US US ART/VEN ABD/PELV/SCROTUM DOPPLER LTD
1 series · 13 of 25 positions shown · non-contrast
Comparison: None.

CLINICAL DATA: Left lower quadrant pain.

EXAM:
TRANSABDOMINAL AND TRANSVAGINAL ULTRASOUND OF PELVIS
DOPPLER ULTRASOUND OF OVARIES
TECHNIQUE: Both transabdominal and transvaginal ultrasound examinations of the
pelvis were performed. Transabdominal technique was performed for
global imaging of the pelvis including uterus, ovaries, adnexal
regions, and pelvic cul-de-sac.
It was necessary to proceed with endovaginal exam following the
transabdominal exam to visualize the left ovary. Color and duplex
Doppler ultrasound was utilized to evaluate blood flow to the
ovaries.

[Series 1: us pelvis (transabdominal only) · 13 of 78 slices shown]
[im 1/78]
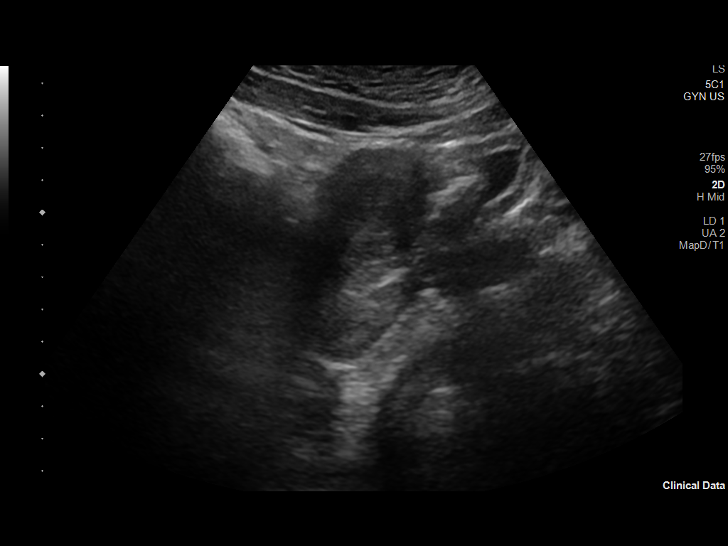
[im 7/78]
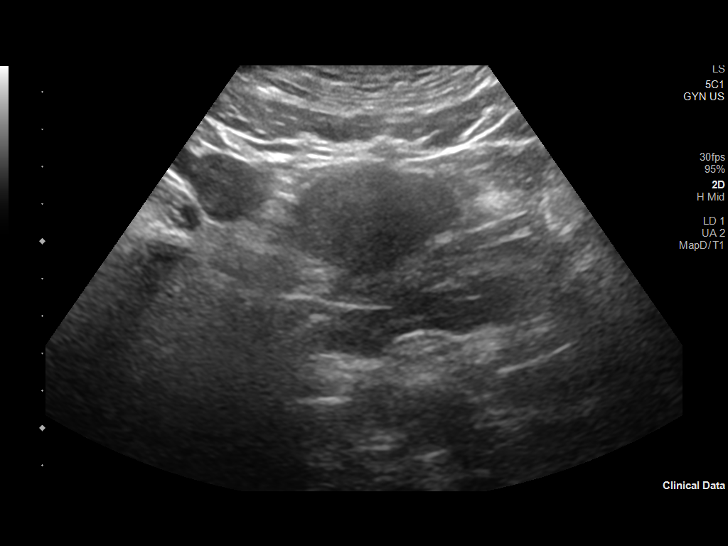
[im 13/78]
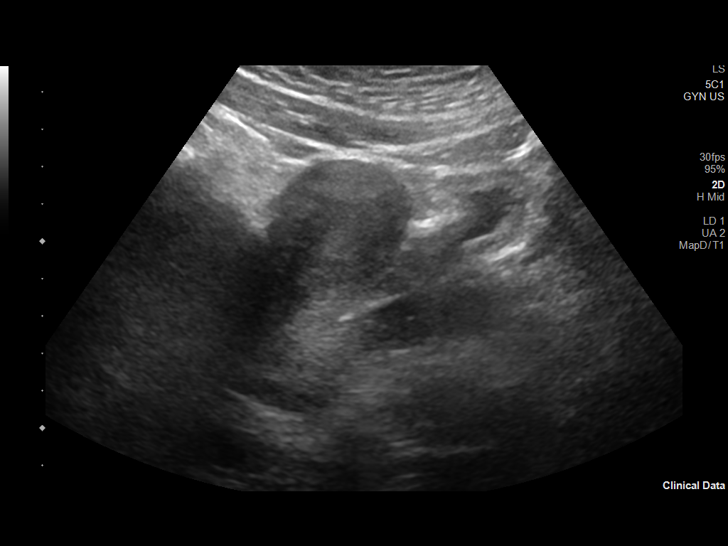
[im 20/78]
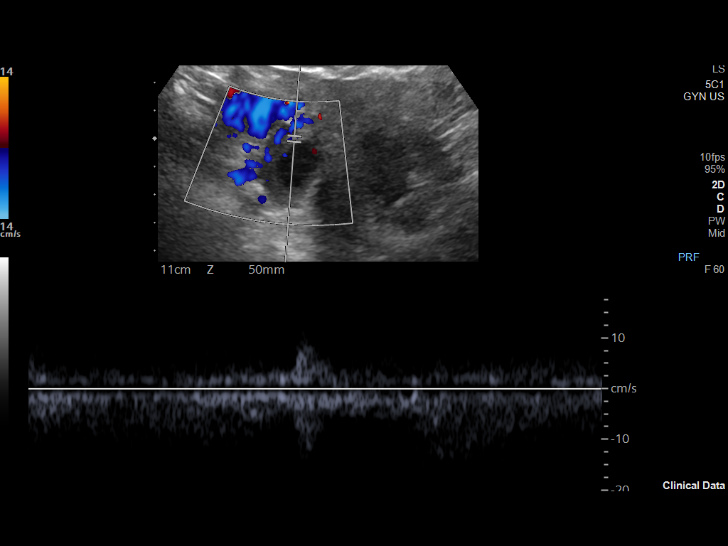
[im 26/78]
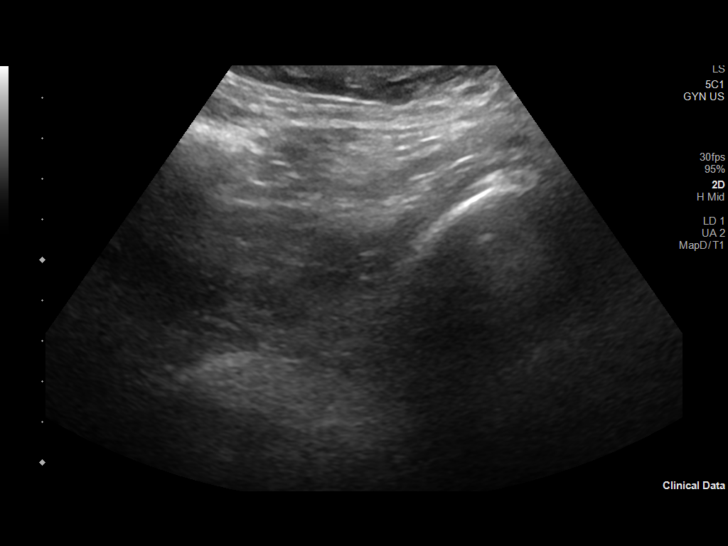
[im 33/78]
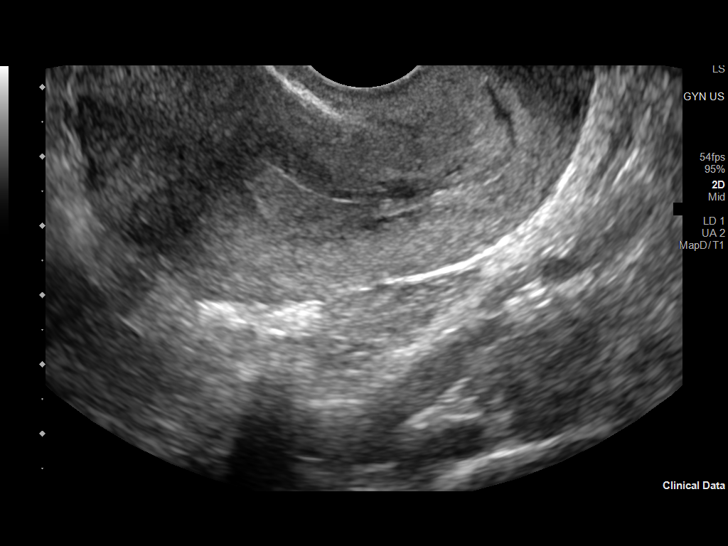
[im 39/78]
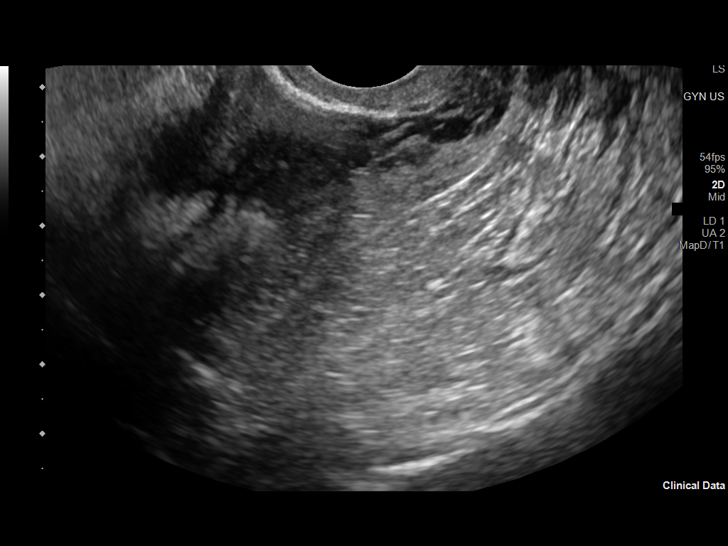
[im 45/78]
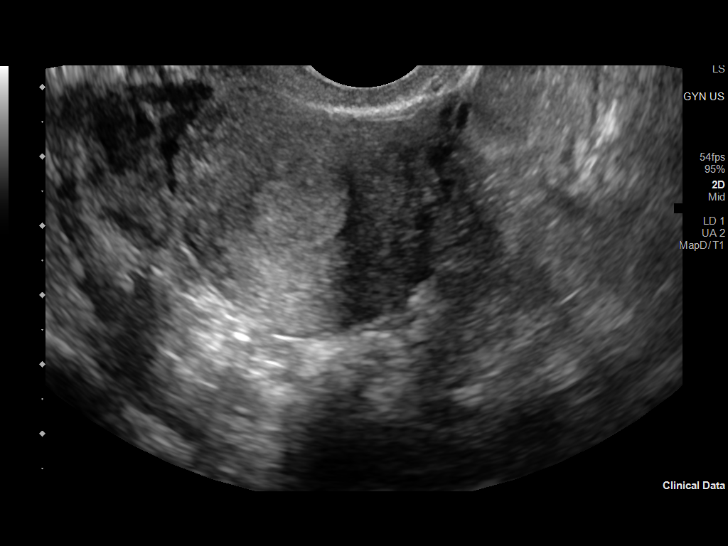
[im 52/78]
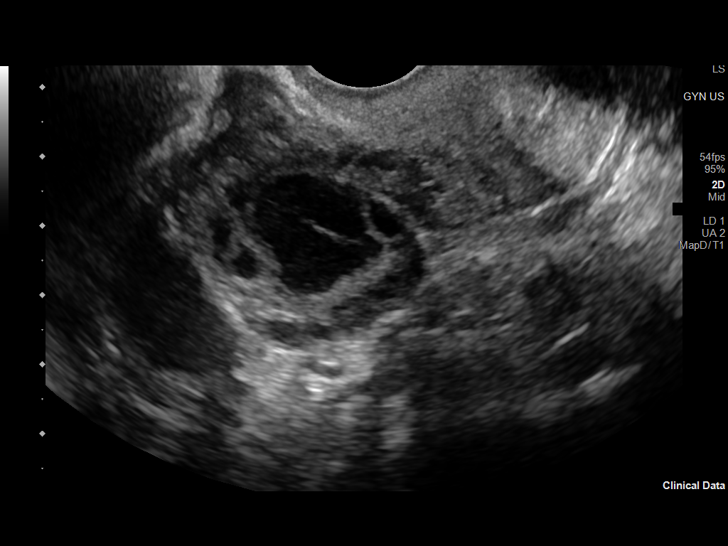
[im 58/78]
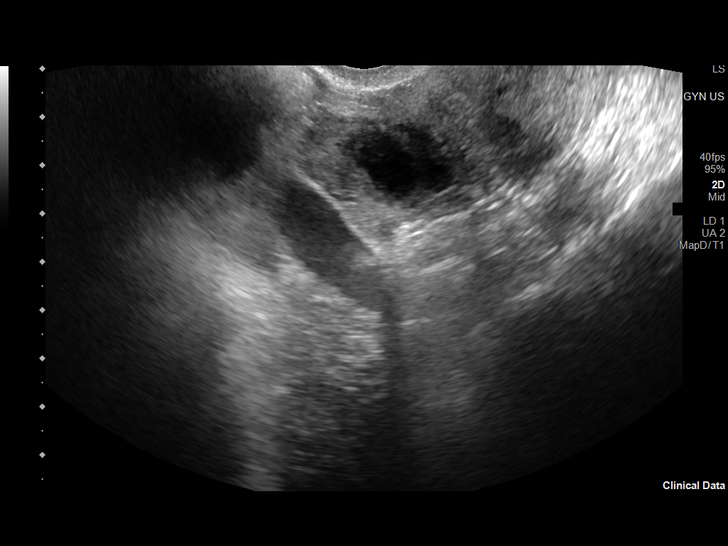
[im 65/78]
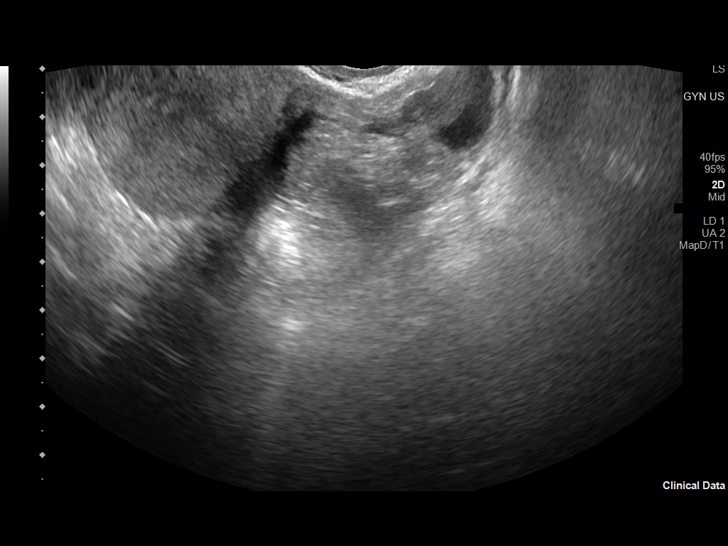
[im 71/78]
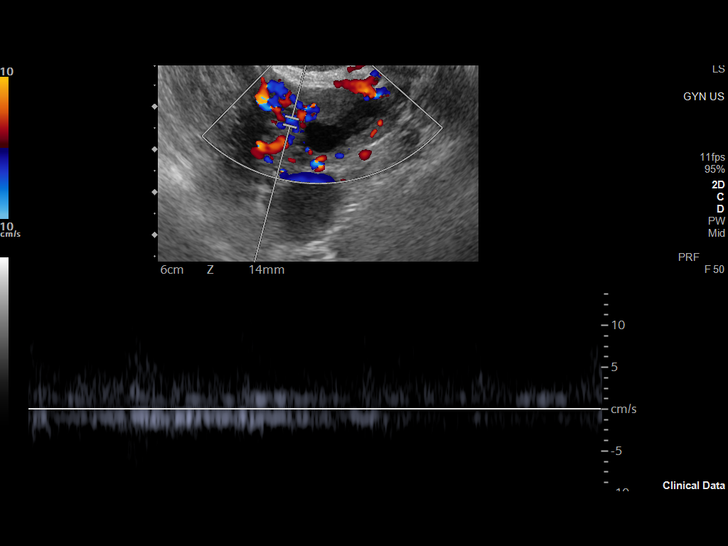
[im 78/78]
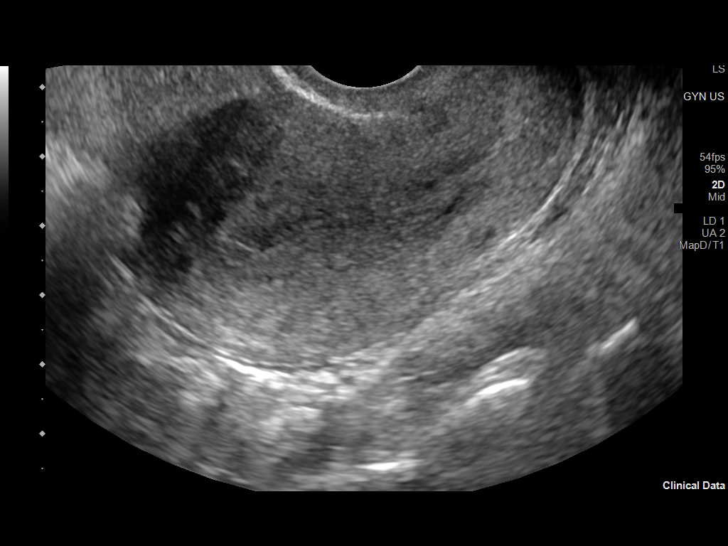

[13 of 25 positions shown; findings below may reference images not displayed]

FINDINGS: Uterus

Measurements: 6.8 x 4.4 x 3.8 cm = volume: 60 mL. No fibroids or
other mass visualized.

Endometrium

Thickness: 10.3.  No focal abnormality visualized.

Right ovary

Measurements: 3.3 x 3.4 x 2.6 cm = volume: 15 mL. 2.0 x 1.6 x 1.5 cm
hemorrhagic/corpus luteal cyst in the right ovary, which is
physiologic and requires no follow-up.

Left ovary

Measurements: 3.5 x 3.4 x 1.7 cm = volume: 11 mL. Normal
appearance/no adnexal mass.

Pulsed Doppler evaluation of both ovaries demonstrates normal
low-resistance arterial and venous waveforms.

Other findings

Trace pelvic free fluid.
IMPRESSION: 2 cm hemorrhagic/corpus luteal cyst in the right ovary which is
physiologic and requires no further imaging follow-up. Otherwise
unremarkable pelvic ultrasound. Specifically no evidence of ovarian
torsion.
